# Patient Record
Sex: Female | Born: 1955 | Race: White | Hispanic: Yes | Marital: Married | Smoking: Never smoker
Health system: Southern US, Community
[De-identification: ages and names within clinical notes are randomized; demographics above are authoritative.]

## PROBLEM LIST (undated history)

## (undated) DIAGNOSIS — Q0700 Arnold-Chiari syndrome without spina bifida or hydrocephalus: Secondary | ICD-10-CM

## (undated) DIAGNOSIS — R0600 Dyspnea, unspecified: Secondary | ICD-10-CM

## (undated) DIAGNOSIS — F329 Major depressive disorder, single episode, unspecified: Secondary | ICD-10-CM

## (undated) DIAGNOSIS — R011 Cardiac murmur, unspecified: Secondary | ICD-10-CM

## (undated) DIAGNOSIS — G5603 Carpal tunnel syndrome, bilateral upper limbs: Secondary | ICD-10-CM

## (undated) DIAGNOSIS — Z972 Presence of dental prosthetic device (complete) (partial): Secondary | ICD-10-CM

## (undated) DIAGNOSIS — F419 Anxiety disorder, unspecified: Secondary | ICD-10-CM

## (undated) DIAGNOSIS — F32A Depression, unspecified: Secondary | ICD-10-CM

## (undated) DIAGNOSIS — E079 Disorder of thyroid, unspecified: Secondary | ICD-10-CM

## (undated) HISTORY — PX: TUBAL LIGATION: SHX77

---

## 2005-02-23 ENCOUNTER — Emergency Department: Payer: Self-pay | Admitting: Emergency Medicine

## 2009-03-29 ENCOUNTER — Ambulatory Visit: Payer: Self-pay | Admitting: Family Medicine

## 2011-10-28 ENCOUNTER — Ambulatory Visit: Payer: Self-pay | Admitting: Family Medicine

## 2015-02-12 ENCOUNTER — Other Ambulatory Visit: Payer: Self-pay | Admitting: Physician Assistant

## 2015-02-12 DIAGNOSIS — Z0000 Encounter for general adult medical examination without abnormal findings: Secondary | ICD-10-CM

## 2015-03-01 ENCOUNTER — Ambulatory Visit: Payer: Self-pay | Attending: Physician Assistant

## 2016-05-04 ENCOUNTER — Emergency Department
Admission: EM | Admit: 2016-05-04 | Discharge: 2016-05-04 | Disposition: A | Discharge status: Home / Self Care | Payer: BLUE CROSS/BLUE SHIELD | Attending: Emergency Medicine | Admitting: Emergency Medicine

## 2016-05-04 ENCOUNTER — Encounter: Payer: Self-pay | Admitting: Emergency Medicine

## 2016-05-04 ENCOUNTER — Emergency Department: Payer: BLUE CROSS/BLUE SHIELD

## 2016-05-04 DIAGNOSIS — G44209 Tension-type headache, unspecified, not intractable: Secondary | ICD-10-CM | POA: Diagnosis not present

## 2016-05-04 DIAGNOSIS — G43909 Migraine, unspecified, not intractable, without status migrainosus: Secondary | ICD-10-CM | POA: Diagnosis present

## 2016-05-04 MED ORDER — LORAZEPAM 1 MG PO TABS
1.0000 mg | ORAL_TABLET | Freq: Two times a day (BID) | ORAL | 0 refills | Status: DC | PRN
Start: 2016-05-04 — End: 2016-08-20

## 2016-05-04 MED ORDER — TRAMADOL HCL 50 MG PO TABS: 50.0000 mg | ORAL_TABLET | Freq: Four times a day (QID) | ORAL | 0 refills | Status: DC | PRN

## 2016-05-04 NOTE — ED Provider Notes (Signed)
Time Seen: Approximately 2142  I have reviewed the triage notes  Chief Complaint: Migraine   History of Present Illness: Alejandra Navarro is a 60 y.o. female who complains of multiple medical issues but seems to be that her headache is the main concern. Patient's history, review of systems, etc. is taken through SenegalSpanish translator. She is describing pain in both trapezius areas and a headache that starts mainly over the occipital area and moves forward into the temporal region. She apparently is been worked up rather extensively for carpal tunnel syndrome and has had nerve conduction studies. She is scheduled for outpatient surgery. Patient complained of some groin pain but not primarily to this historian and seems to be more focused on her headache and neck discomfort. She denies any nausea, vomiting, etc.   History reviewed. No pertinent past medical history.  There are no active problems to display for this patient.   Past Surgical History:  Procedure Laterality Date  . CESAREAN SECTION      Past Surgical History:  Procedure Laterality Date  . CESAREAN SECTION      Current Outpatient Rx  . Order #: [id number] Class: Print  . Order #: [id number] Class: Print    Allergies:  Review of patient's allergies indicates no known allergies.  Family History: No family history on file.  Social History: Social History  Substance Use Topics  . Smoking status: Never Smoker  . Smokeless tobacco: Never Used  . Alcohol use No     Review of Systems:   10 point review of systems was performed and was otherwise negative:  Constitutional: No fever Eyes: No visual disturbances ENT: No sore throat, ear pain Cardiac: No chest pain Respiratory: No shortness of breath, wheezing, or stridor Abdomen: No Current abdominal pain, no vomiting, No diarrhea Endocrine: No weight loss, No night sweats Extremities: No peripheral edema, cyanosis Skin: No rashes, easy bruising Neurologic: No  focal weakness, trouble with speech or swollowing Urologic: No dysuria, Hematuria, or urinary frequency   Physical Exam:  ED Triage Vitals  Enc Vitals Group     BP 05/04/16 2005 (!) 162/82     Pulse Rate 05/04/16 2005 (!) 52     Resp 05/04/16 2005 18     Temp 05/04/16 2005 98 F (36.7 C)     Temp Source 05/04/16 2005 Oral     SpO2 05/04/16 2005 99 %     Weight 05/04/16 2006 143 lb (64.9 kg)     Height 05/04/16 2006 5' (1.524 m)     Head Circumference --      Peak Flow --      Pain Score 05/04/16 2006 7     Pain Loc --      Pain Edu? --      Excl. in GC? --     General: Awake , Alert , and Oriented times 3; GCS 15Very anxious and seems to be somewhat of a scattered historian even with the language Head: Reproducible headache with palpation across the temporal region  Eyes: Pupils equal , round, reactive to light Nose/Throat: No nasal drainage, patent upper airway without erythema or exudate. No meningeal signs Neck: Supple, Full range of motion, No anterior adenopathy or palpable thyroid masses Lungs: Clear to ascultation without wheezes , rhonchi, or rales Heart: Regular rate, regular rhythm without murmurs , gallops , or rubs Abdomen: Soft, non tender without rebound, guarding , or rigidity; bowel sounds positive and symmetric in all 4 quadrants. No organomegaly .  Extremities: 2 plus symmetric pulses. No edema, clubbing or cyanosis Neurologic: normal ambulation, Motor symmetric without deficits, sensory intact Skin: warm, dry, no rashes   L  Radiology:  "Ct Head Wo Contrast  Result Date: 05/04/2016 CLINICAL DATA:  Acute onset of headache for 2 days. Initial encounter. EXAM: CT HEAD WITHOUT CONTRAST TECHNIQUE: Contiguous axial images were obtained from the base of the skull through the vertex without intravenous contrast. COMPARISON:  None. FINDINGS: There is no evidence of acute infarction, mass lesion, or intra- or extra-axial hemorrhage on CT. 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 The posterior fossa, including the cerebellum, brainstem and fourth ventricle, is within normal limits. The third and lateral ventricles, and basal ganglia are unremarkable in appearance. The cerebral hemispheres are symmetric in appearance, with normal gray-white differentiation. No mass effect or midline shift is seen. There is no evidence of fracture; visualized osseous structures are unremarkable in appearance. The visualized portions of the orbits are within normal limits. The paranasal sinuses and mastoid air cells are well-aerated. No significant soft tissue abnormalities are seen. IMPRESSION: 1. No acute intracranial pathology seen on CT. 2. Apparent Chiari I malformation, with low-lying cerebellar tonsils. This might correspond to the patient's symptoms, depending on chronicity of headache. Electronically Signed   By: Roanna RaiderJeffery  Chang M.D.   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 The shoulder was discharged with pain control and wished to be discharged before the official results of her CAT scan returned. She does [facility].  * Clinical Course     Assessment:  Acute cephalgia Tension-type headache Arnold-Chiari type I  Final Clinical Impression  Final diagnoses:  Tension-type headache, not intractable, unspecified chronicity pattern     Plan: Outpatient " Discharge Medication List as of 05/04/2016 11:04 PM    START taking these medications   Details  LORazepam (ATIVAN) 1 MG tablet Take 1 tablet (1 mg total) by mouth 2 (two) times daily as needed for anxiety., Starting Mon 05/04/2016, Print    traMADol (ULTRAM) 50 MG tablet Take 1 tablet (50 mg total) by mouth every 6 (six) hours as needed.,  Starting Mon 05/04/2016, Print      " Patient was advised to return immediately if condition worsens. Patient was advised to follow up with their primary care physician or other specialized physicians involved in their outpatient care. The patient and/or family member/power of attorney had laboratory results reviewed at the bedside. All questions and concerns were addressed and appropriate discharge instructions were distributed by the nursing staff.            Jennye MoccasinBrian S Quigley, MD 05/04/16 [phone]

## 2016-05-04 NOTE — Discharge Instructions (Signed)
Continue with follow-up with your primary doctor. Please drink plenty of fluids and return here to the emergency department especially if develops a fever or weakness. Continue follow-up for her carpal tunnel syndrome.

## 2016-05-04 NOTE — ED Triage Notes (Addendum)
Pt presents to ED via wheelchair with multiple medical complaints. Reports headache since yesterday. Pt has hx of headaches. Pt reports photosensitivity, nausea and blurry vision. Pt reports took ibuprofen without relief. Pt also reports was [facility] and was told to wait for a call from surgeon, pt reports today groin pain has been increasing. Pt alert and oriented x 4, no increased work in breathing, skin warm and dry.

## 2016-05-04 NOTE — ED Notes (Signed)
Patient discharged to home per MD order. Patient in stable condition, and deemed medically cleared by ED provider for discharge. Discharge instructions reviewed with patient/family using "Teach Back"; verbalized understanding of medication education and administration, and information about follow-up care. Denies further concerns. ° °

## 2016-06-22 ENCOUNTER — Other Ambulatory Visit: Payer: Self-pay | Admitting: Internal Medicine

## 2016-06-22 DIAGNOSIS — Z1231 Encounter for screening mammogram for malignant neoplasm of breast: Secondary | ICD-10-CM

## 2016-07-14 ENCOUNTER — Ambulatory Visit: Payer: BLUE CROSS/BLUE SHIELD

## 2016-08-05 ENCOUNTER — Ambulatory Visit
Admission: RE | Admit: 2016-08-05 | Discharge: 2016-08-05 | Disposition: A | Discharge status: Home / Self Care | Payer: BLUE CROSS/BLUE SHIELD | Source: Ambulatory Visit | Attending: Internal Medicine | Admitting: Internal Medicine

## 2016-08-05 DIAGNOSIS — Z1231 Encounter for screening mammogram for malignant neoplasm of breast: Secondary | ICD-10-CM | POA: Insufficient documentation

## 2016-08-10 ENCOUNTER — Encounter
Admission: RE | Admit: 2016-08-10 | Discharge: 2016-08-10 | Disposition: A | Discharge status: Home / Self Care | Payer: BLUE CROSS/BLUE SHIELD | Source: Ambulatory Visit | Attending: Surgery | Admitting: Surgery

## 2016-08-10 DIAGNOSIS — Z01818 Encounter for other preprocedural examination: Secondary | ICD-10-CM | POA: Insufficient documentation

## 2016-08-10 DIAGNOSIS — R0602 Shortness of breath: Secondary | ICD-10-CM | POA: Diagnosis not present

## 2016-08-10 HISTORY — DX: Cardiac murmur, unspecified: R01.1

## 2016-08-10 HISTORY — DX: Dyspnea, unspecified: R06.00

## 2016-08-10 HISTORY — DX: Depression, unspecified: F32.A

## 2016-08-10 HISTORY — DX: Carpal tunnel syndrome, bilateral upper limbs: G56.03

## 2016-08-10 HISTORY — DX: Anxiety disorder, unspecified: F41.9

## 2016-08-10 HISTORY — DX: Disorder of thyroid, unspecified: E07.9

## 2016-08-10 HISTORY — DX: Major depressive disorder, single episode, unspecified: F32.9

## 2016-08-10 LAB — COMPREHENSIVE METABOLIC PANEL
ALK PHOS: 59 U/L (ref 38–126)
ALT: 16 U/L (ref 14–54)
AST: 23 U/L (ref 15–41)
Albumin: 4.3 g/dL (ref 3.5–5.0)
Anion gap: 7 (ref 5–15)
BUN: 17 mg/dL (ref 6–20)
CALCIUM: 8.9 mg/dL (ref 8.9–10.3)
CHLORIDE: 107 mmol/L (ref 101–111)
CO2: 26 mmol/L (ref 22–32)
CREATININE: 0.64 mg/dL (ref 0.44–1.00)
GFR calc Af Amer: >60 mL/min (ref >60)
GFR calc non Af Amer: >60 mL/min (ref >60)
GLUCOSE: 88 mg/dL (ref 65–99)
Potassium: 3.7 mmol/L (ref 3.5–5.1)
SODIUM: 140 mmol/L (ref 135–145)
Total Bilirubin: 0.7 mg/dL (ref 0.3–1.2)
Total Protein: 7.3 g/dL (ref 6.5–8.1)

## 2016-08-10 LAB — CBC
HEMATOCRIT: 39.9 % (ref 35.0–47.0)
Hemoglobin: 13.5 g/dL (ref 12.0–16.0)
MCH: 30 pg (ref 26.0–34.0)
MCHC: 33.9 g/dL (ref 32.0–36.0)
MCV: 88.5 fL (ref 80.0–100.0)
PLATELETS: 107 10*3/uL — AB (ref 150–440)
RBC: 4.51 MIL/uL (ref 3.80–5.20)
RDW: 13.7 % (ref 11.5–14.5)
WBC: 3.9 10*3/uL (ref 3.6–11.0)

## 2016-08-10 LAB — DIFFERENTIAL
BASOS ABS: 0 10*3/uL (ref 0–0.1)
BASOS PCT: 0 %
Eosinophils Absolute: 0.1 10*3/uL (ref 0–0.7)
Eosinophils Relative: 1 %
LYMPHS PCT: 35 %
Lymphs Abs: 1.4 10*3/uL (ref 1.0–3.6)
Monocytes Absolute: 0.3 10*3/uL (ref 0.2–0.9)
Monocytes Relative: 8 %
NEUTROS ABS: 2.2 10*3/uL (ref 1.4–6.5)
Neutrophils Relative %: 56 %

## 2016-08-10 NOTE — Patient Instructions (Signed)
Your procedure is scheduled on: August 20, 2016 (Thursday) Su procedimiento est programado para: Report to Day Surgery.Second Floor      [facility]) Presntese a: To find out your arrival time please call [phone] between 1PM - 3PM on August 19, 2016 (Wednesday). Para saber su hora de llegada por favor llame al [phone] entre la 1PM - 3PM el da:  Remember: Instructions that are not followed completely may result in serious medical risk, up to and including death, or upon the discretion of your surgeon and anesthesiologist your surgery may need to be rescheduled.  Recuerde: Las instrucciones que no se siguen completamente Armed forces logistics/support/administrative officerpueden resultar en un riesgo de salud grave, incluyendo hasta la Rock[place]uerte o a discrecin de su cirujano y Scientific laboratory techniciananestesilogo, su ciruga se puede posponer.   __x__ 1. Do not eat food or drink liquids after midnight. No gum chewing or hard candies.  No coma alimentos ni tome lquidos despus de la medianoche.  No mastique chicle ni caramelos  duros.     _x__ 2. No alcohol for 24 hours before or after surgery.    No tome alcohol durante las 24 horas antes ni despus de la Azerba[place]uga.   ____ 3. Bring all medications with you on the day of surgery if instructed.    Lleve todos los medicamentos con usted el da de su ciruga si se le ha indicado as.   _x___ 4. Notify your doctor if there is any change in your medical condition (cold, fever,                             infections).    Informe a su mdico si hay algn cambio en su condicin mdica (resfriado, fiebre, infecciones).   Do not wear jewelry, make-up, hairpins, clips or nail polish.  No use joyas, maquillajes, pinzas/ganchos para el cabello ni esmalte de uas.  Do not wear lotions, powders, or perfumes. You may wear deodorant.  No use lociones, polvos o perfumes.  Puede usar desodorante.    Do not shave 48 hours prior to surgery. Men may shave face and neck.  No se afeite 48 horas antes de la Azerbaijanciruga.  Los  hombres pueden Commercial Metals Companyafeitarse la cara y el cuello.   Do not bring valuables to the hospital.   No lleve objetos de valor al hospital.  [facility] is not responsible for any belongings or valuables.  [place] no se hace responsable de ningn tipo de pertenencias u objetos de Licensed conveyancervalor.               Contacts, dentures or bridgework may not be worn into surgery.  Los lentes de [place]acto, las dentaduras postizas o puentes no se pueden usar en la Azerba[place]uga.  Leave your suitcase in the car. After surgery it may be brought to your room.  Deje su maleta en el auto.  Despus de la ciruga podr traerla a su habitacin.  For patients admitted to the hospital, discharge time is determined by your treatment team.  Para los pacientes que sean ingresados al hospital, el tiempo en el cual se le dar de alta es determinado por su                equipo de Beloittratamiento.   Patients discharged the day of surgery will not be allowed to drive home. A los pacientes que se les da de alta el mismo da de la ciruga no se les Investment banker, operationalpermitir conducir  a casa.   Please read over the following fact sheets that you were given: Por favor lea las siguientes hojas de informacin que le dieron:   Coughing and Deep Breathing   __x__ Take these medicines the morning of surgery with A SIP OF WATER:          Owens-Illinoisome estas medicinas la maana de la ciruga con UN SORBO DE AGUA:  1. Levothyroxine  2.   3.   4.       5.  6.  ____ Fleet Enema (as directed)          Enema de Fleet (segn lo indicado)    _x___ Use CHG Soap as directed--Instruction Sheet given          Utilice el jabn de CHG segn lo indicado  ____ Use inhalers on the day of surgery          Use los inhaladores el da de la ciruga  ____ Stop metformin 2 days prior to surgery          Deje de tomar el metformin 2 das antes de la ciruga    ____ Take 1/2 of usual insulin dose the night before surgery and none on the morning of surgery           Tome la mitad de la  dosis habitual de insulina la noche antes de la Azerbaij[place]iruga y no tome nada en la maana de la             ciruga  __x__ Stop Coumadin/Plavix/aspirin on (NO ASPIRIN)          Deje de tomar el Coumadin/Plavix/aspirina el da:  __x__ Stop Anti-inflammatories on (NO NSAIDS)          Deje de tomar Doctor, general practiceantiinflamatorios el da:   __x__ Stop supplements until after surgery  (STOP  VEGGI LIFE NOW)          Deje de tomar suplementos hasta despus de la ciruga  ____ Bring C-Pap to the hospital          Lleve el C-Pap al hospital

## 2016-08-10 NOTE — Pre-Procedure Instructions (Signed)
Medical clearance received from Dr.Hande, and placed on chart.

## 2016-08-10 NOTE — Pre-Procedure Instructions (Signed)
Component Name Value Ref Range  Vent Rate (bpm) 61   PR Interval (msec) 120   QRS Interval (msec) 80   QT Interval (msec) 430   QTc (msec) 432   Result Narrative  Normal sinus rhythm Possible Left atrial enlargement Left ventricular hypertrophy Nonspecific T wave abnormalities Abnormal ECG When compared with ECG of 01-Apr-2015 16:21, No significant change was found I reviewed and concur with this report. Electronically signed AV:[email or web address]:HANDE MD, VISH [phone]) on 07/21/2016 2:59:58 PM  Status Results Details

## 2016-08-19 MED ORDER — CEFAZOLIN SODIUM-DEXTROSE 2-4 GM/100ML-% IV SOLN
2.0000 g | Freq: Once | INTRAVENOUS | Status: AC
  Administered 2016-08-20: 2 g via INTRAVENOUS

## 2016-08-20 ENCOUNTER — Ambulatory Visit
Admission: RE | Admit: 2016-08-20 | Discharge: 2016-08-20 | Disposition: A | Discharge status: Home / Self Care | Payer: BLUE CROSS/BLUE SHIELD | Source: Ambulatory Visit | Attending: Surgery | Admitting: Surgery

## 2016-08-20 ENCOUNTER — Ambulatory Visit: Payer: BLUE CROSS/BLUE SHIELD | Admitting: Anesthesiology

## 2016-08-20 ENCOUNTER — Encounter
Admission: RE | Disposition: A | Discharge status: Home / Self Care | Payer: Self-pay | Source: Ambulatory Visit | Attending: Surgery

## 2016-08-20 ENCOUNTER — Encounter: Payer: Self-pay | Admitting: *Deleted

## 2016-08-20 DIAGNOSIS — F419 Anxiety disorder, unspecified: Secondary | ICD-10-CM | POA: Insufficient documentation

## 2016-08-20 DIAGNOSIS — R011 Cardiac murmur, unspecified: Secondary | ICD-10-CM | POA: Insufficient documentation

## 2016-08-20 DIAGNOSIS — E079 Disorder of thyroid, unspecified: Secondary | ICD-10-CM | POA: Diagnosis not present

## 2016-08-20 DIAGNOSIS — K4090 Unilateral inguinal hernia, without obstruction or gangrene, not specified as recurrent: Secondary | ICD-10-CM | POA: Insufficient documentation

## 2016-08-20 DIAGNOSIS — F329 Major depressive disorder, single episode, unspecified: Secondary | ICD-10-CM | POA: Diagnosis not present

## 2016-08-20 DIAGNOSIS — R0600 Dyspnea, unspecified: Secondary | ICD-10-CM | POA: Insufficient documentation

## 2016-08-20 HISTORY — PX: INGUINAL HERNIA REPAIR: SHX194

## 2016-08-20 SURGERY — REPAIR, HERNIA, INGUINAL, ADULT
Anesthesia: General | Laterality: Right

## 2016-08-20 MED ORDER — FENTANYL CITRATE (PF) 100 MCG/2ML IJ SOLN
INTRAMUSCULAR | Status: AC
Start: 2016-08-20 — End: 2016-08-20
  Administered 2016-08-20: 25 ug via INTRAVENOUS
  Filled 2016-08-20: qty 2

## 2016-08-20 MED ORDER — OXYCODONE HCL 5 MG/5ML PO SOLN: 5.0000 mg | Freq: Once | ORAL | Status: AC | PRN

## 2016-08-20 MED ORDER — OXYCODONE HCL 5 MG PO TABS
5.0000 mg | ORAL_TABLET | Freq: Once | ORAL | Status: AC | PRN
  Administered 2016-08-20: 5 mg via ORAL

## 2016-08-20 MED ORDER — OXYCODONE HCL 5 MG PO TABS
ORAL_TABLET | ORAL | Status: AC
  Filled 2016-08-20: qty 1

## 2016-08-20 MED ORDER — PROPOFOL 10 MG/ML IV BOLUS
INTRAVENOUS | Status: DC | PRN
  Administered 2016-08-20: 110 mg via INTRAVENOUS

## 2016-08-20 MED ORDER — BUPIVACAINE-EPINEPHRINE (PF) 0.5% -1:200000 IJ SOLN
INTRAMUSCULAR | Status: DC | PRN
  Administered 2016-08-20: 15 mL via PERINEURAL

## 2016-08-20 MED ORDER — ROCURONIUM BROMIDE 100 MG/10ML IV SOLN
INTRAVENOUS | Status: DC | PRN
  Administered 2016-08-20: 30 mg via INTRAVENOUS

## 2016-08-20 MED ORDER — FAMOTIDINE 20 MG PO TABS
20.0000 mg | ORAL_TABLET | Freq: Once | ORAL | Status: AC
  Administered 2016-08-20: 20 mg via ORAL

## 2016-08-20 MED ORDER — FAMOTIDINE 20 MG PO TABS
ORAL_TABLET | ORAL | Status: AC
  Filled 2016-08-20: qty 1

## 2016-08-20 MED ORDER — CEFAZOLIN SODIUM-DEXTROSE 2-4 GM/100ML-% IV SOLN
INTRAVENOUS | Status: AC
  Filled 2016-08-20: qty 100

## 2016-08-20 MED ORDER — FENTANYL CITRATE (PF) 100 MCG/2ML IJ SOLN
INTRAMUSCULAR | Status: DC | PRN
  Administered 2016-08-20: 100 ug via INTRAVENOUS

## 2016-08-20 MED ORDER — BUPIVACAINE-EPINEPHRINE (PF) 0.5% -1:200000 IJ SOLN
INTRAMUSCULAR | Status: AC
  Filled 2016-08-20: qty 30

## 2016-08-20 MED ORDER — HYDROCODONE-ACETAMINOPHEN 5-325 MG PO TABS: 1.0000 | ORAL_TABLET | ORAL | 0 refills | Status: DC | PRN

## 2016-08-20 MED ORDER — DEXAMETHASONE SODIUM PHOSPHATE 10 MG/ML IJ SOLN
INTRAMUSCULAR | Status: DC | PRN
  Administered 2016-08-20: 4 mg via INTRAVENOUS

## 2016-08-20 MED ORDER — ONDANSETRON HCL 4 MG/2ML IJ SOLN
INTRAMUSCULAR | Status: DC | PRN
  Administered 2016-08-20: 4 mg via INTRAVENOUS

## 2016-08-20 MED ORDER — LACTATED RINGERS IV SOLN
INTRAVENOUS | Status: DC
  Administered 2016-08-20: 07:00:00 via INTRAVENOUS

## 2016-08-20 MED ORDER — SUGAMMADEX SODIUM 200 MG/2ML IV SOLN
INTRAVENOUS | Status: DC | PRN
  Administered 2016-08-20: 130 mg via INTRAVENOUS

## 2016-08-20 MED ORDER — FLEET ENEMA 7-19 GM/118ML RE ENEM: 1.0000 | ENEMA | Freq: Once | RECTAL | Status: DC

## 2016-08-20 MED ORDER — HYDROCODONE-ACETAMINOPHEN 5-325 MG PO TABS: 1.0000 | ORAL_TABLET | ORAL | Status: DC | PRN

## 2016-08-20 MED ORDER — LIDOCAINE HCL (CARDIAC) 20 MG/ML IV SOLN
INTRAVENOUS | Status: DC | PRN
  Administered 2016-08-20: 80 mg via INTRAVENOUS

## 2016-08-20 MED ORDER — FENTANYL CITRATE (PF) 100 MCG/2ML IJ SOLN
25.0000 ug | INTRAMUSCULAR | Status: DC | PRN
  Administered 2016-08-20 (×2): 25 ug via INTRAVENOUS

## 2016-08-20 SURGICAL SUPPLY — 24 items
BLADE SURG 15 STRL LF DISP TIS (BLADE) ×1 IMPLANT
BLADE SURG 15 STRL SS (BLADE) ×1
CANISTER SUCT 1200ML W/VALVE (MISCELLANEOUS) ×2 IMPLANT
CHLORAPREP W/TINT 26ML (MISCELLANEOUS) ×4 IMPLANT
DERMABOND ADVANCED (GAUZE/BANDAGES/DRESSINGS) ×1
DERMABOND ADVANCED .7 DNX12 (GAUZE/BANDAGES/DRESSINGS) ×1 IMPLANT
DRAIN PENROSE 5/8X18 LTX STRL (WOUND CARE) ×2 IMPLANT
DRAPE LAPAROTOMY 77X122 PED (DRAPES) ×2 IMPLANT
ELECT REM PT RETURN 9FT ADLT (ELECTROSURGICAL) ×2
ELECTRODE REM PT RTRN 9FT ADLT (ELECTROSURGICAL) ×1 IMPLANT
GLOVE BIO SURGEON STRL SZ7.5 (GLOVE) ×10 IMPLANT
GOWN STRL REUS W/ TWL LRG LVL3 (GOWN DISPOSABLE) ×3 IMPLANT
GOWN STRL REUS W/TWL LRG LVL3 (GOWN DISPOSABLE) ×3
KIT RM TURNOVER STRD PROC AR (KITS) ×2 IMPLANT
LABEL OR SOLS (LABEL) ×2 IMPLANT
NEEDLE HYPO 25X1 1.5 SAFETY (NEEDLE) ×2 IMPLANT
NS IRRIG 500ML POUR BTL (IV SOLUTION) ×2 IMPLANT
PACK BASIN MINOR ARMC (MISCELLANEOUS) ×2 IMPLANT
SUT CHROMIC 4 0 RB 1X27 (SUTURE) ×2 IMPLANT
SUT MNCRL AB 4-0 PS2 18 (SUTURE) ×2 IMPLANT
SUT SURGILON 0 30 BLK (SUTURE) ×6 IMPLANT
SUT VIC AB 4-0 SH 27 (SUTURE) ×2
SUT VIC AB 4-0 SH 27XANBCTRL (SUTURE) ×2 IMPLANT
SYRINGE 10CC LL (SYRINGE) ×2 IMPLANT

## 2016-08-20 NOTE — Anesthesia Preprocedure Evaluation (Signed)
Anesthesia Evaluation  Patient identified by MRN, date of birth, ID band Patient awake    Reviewed: Allergy & Precautions, H&P , NPO status , Patient's Chart, lab work & pertinent test results  History of Anesthesia Complications (+) PROLONGED EMERGENCE and history of anesthetic complications  Airway Mallampati: III  TM Distance: >3 FB Neck ROM: limited    Dental no notable dental hx. (+) Poor Dentition, Chipped, Implants   Pulmonary shortness of breath,    Pulmonary exam normal breath sounds clear to auscultation       Cardiovascular Exercise Tolerance: Good (-) angina+ DOE  Normal cardiovascular exam+ Valvular Problems/Murmurs AI and MR  Rhythm:regular Rate:Normal     Neuro/Psych PSYCHIATRIC DISORDERS Anxiety Depression  Neuromuscular disease    GI/Hepatic negative GI ROS, Neg liver ROS,   Endo/Other  negative endocrine ROS  Renal/GU      Musculoskeletal   Abdominal   Peds  Hematology negative hematology ROS (+)   Anesthesia Other Findings Past Medical History: No date: Anxiety No date: Bilateral carpal tunnel syndrome No date: Depression No date: Dyspnea     Comment: with exertion No date: Heart murmur No date: Thyroid disease  Past Surgical History: No date: CESAREAN SECTION     Comment: X 1 No date: TUBAL LIGATION  BMI    Body Mass Index:  26.16 kg/m      Reproductive/Obstetrics negative OB ROS                             Anesthesia Physical Anesthesia Plan  ASA: III  Anesthesia Plan: General ETT   Post-op Pain Management:    Induction:   Airway Management Planned:   Additional Equipment:   Intra-op Plan:   Post-operative Plan:   Informed Consent: I have reviewed the patients History and Physical, chart, labs and discussed the procedure including the risks, benefits and alternatives for the proposed anesthesia with the patient or authorized representative  who has indicated his/her understanding and acceptance.   Dental Advisory Given  Plan Discussed with: Anesthesiologist, CRNA and Surgeon  Anesthesia Plan Comments:         Anesthesia Quick Evaluation

## 2016-08-20 NOTE — OR Nursing (Signed)
Fleets given

## 2016-08-20 NOTE — OR Nursing (Signed)
Pt states small amount of stool with lots of water from enema sensation to have bowel movement gone

## 2016-08-20 NOTE — Anesthesia Postprocedure Evaluation (Signed)
Anesthesia Post Note  Patient: Alejandra Navarro  Procedure(s) Performed: Procedure(s) (LRB): HERNIA REPAIR INGUINAL ADULT (Right)  Patient location during evaluation: PACU Anesthesia Type: General Level of consciousness: awake and alert Pain management: pain level controlled Vital Signs Assessment: post-procedure vital signs reviewed and stable Respiratory status: spontaneous breathing, nonlabored ventilation, respiratory function stable and patient connected to nasal cannula oxygen Cardiovascular status: blood pressure returned to baseline and stable Postop Assessment: no signs of nausea or vomiting Anesthetic complications: no    Last Vitals:  Vitals:   08/20/16 1001 08/20/16 1029  BP: 120/60 124/78  Pulse: (!) 59   Resp: 16 16  Temp:      Last Pain:  Vitals:   08/20/16 0947  TempSrc:   PainSc: 2                  Cleda MccreedyJoseph K Piscitello

## 2016-08-20 NOTE — Anesthesia Procedure Notes (Signed)
Procedure Name: Intubation Date/Time: 08/20/2016 7:50 AM Performed by: Peyton NajjarSIMMONS, Aleida Crandell Pre-anesthesia Checklist: Patient identified, Patient being monitored, Timeout performed, Emergency Drugs available and Suction available Patient Re-evaluated:Patient Re-evaluated prior to inductionOxygen Delivery Method: Circle system utilized Preoxygenation: Pre-oxygenation with 100% oxygen Intubation Type: IV induction Ventilation: Mask ventilation without difficulty Laryngoscope Size: Mac and 3 Grade View: Grade I Tube type: Oral Tube size: 6.5 mm Number of attempts: 1 Placement Confirmation: ETT inserted through vocal cords under direct vision,  positive ETCO2 and breath sounds checked- equal and bilateral Secured at: 21 cm Tube secured with: Tape Dental Injury: Teeth and Oropharynx as per pre-operative assessment

## 2016-08-20 NOTE — Transfer of Care (Addendum)
Immediate Anesthesia Transfer of Care Note  Patient: Alejandra Navarro  Procedure(s) Performed: Procedure(s): HERNIA REPAIR INGUINAL ADULT (Right)  Patient Location: PACU  Anesthesia Type:General  Level of Consciousness: awake, alert , oriented and patient cooperative  Airway & Oxygen Therapy: Patient Spontanous Breathing and Patient connected to nasal cannula oxygen  Post-op Assessment: Report given to RN, Post -op Vital signs reviewed and stable and Patient moving all extremities  Post vital signs: Reviewed and stable  Last Vitals:  Vitals:   08/20/16 0613 08/20/16 0902  BP: (!) 141/88 125/76  Pulse: 69 81  Resp: 16 11  Temp: 36.9 C 36.8 C    Last Pain:  Vitals:   08/20/16 0613  TempSrc: Tympanic  PainSc: 8          Complications: No apparent anesthesia complications

## 2016-08-20 NOTE — H&P (Signed)
  She comes in today prepared for right inguinal hernia repair.  She does report some constipation and feels that she needs to move her bowels but can't.  The right side was marked YES  Plan is to give her a Fleet's enema prior to surgery. I discussed the plan for surgery

## 2016-08-20 NOTE — OR Nursing (Signed)
Dr Katrinka BlazingSmith states does not need to see pt. Interpreter in to discuss discharge instructions with pt.

## 2016-08-20 NOTE — Op Note (Signed)
OPERATIVE REPORT  PREOPERATIVE DIAGNOSIS: right inguinal hernia  POSTOPERATIVE DIAGNOSIS:right  inguinal hernia  PROCEDURE:  right inguinal hernia repair  ANESTHESIA:  General  SURGEON:  Renda RollsWilton Smith M.D.  INDICATIONS: She reported bulging in the right groin. A right inguinal hernia was demonstrated on physical exam and repair was recommended for definitive treatment.  With the patient on the operating table in the supine position the right lower quadrant was prepared with ChloraPrep and draped in a sterile manner. A transversely oriented suprapubic incision was made and carried down through subcutaneous tissues. Electrocautery was used for hemostasis. The Scarpa's fascia was incised. The external oblique aponeurosis was incised along the course of its fibers to open the external ring . There was a finding of an indirect hernia sac which was dissected free from surrounding structures. The round ligament was identified and ligated with 4-0 chromic suture ligature and divided. The sac was opened and its continuity with the peritoneal cavity was demonstrated. A high ligation of the sac was done with a 4-0 Vicryl suture ligature. The sac was excised and the stump was allowed to retract. The repair was carried out with 0 Surgilon suturing the conjoined tendon to the shelving edge of the inguinal ligament and completely obliterated the internal ring.  The cut edges of the external oblique aponeurosis were closed with a running 4-0 Vicryl suture to re-create the external ring. The deep fascia superior and lateral to the repair site was infiltrated with half percent Sensorcaine with epinephrine. Subcutaneous tissues were also infiltrated. The Scarpa's fascia was closed with interrupted 4-0 Vicryl sutures. The skin was closed with running 4-0 Monocryl subcuticular suture and LiquiBand. The testicle remained in the scrotum  The patient appeared to be in satisfactory condition and was prepared for transfer to  the recovery room.  Renda RollsWilton Smith M.D.

## 2016-08-20 NOTE — Discharge Instructions (Addendum)
Take Tylenol or Norco if needed for pain.  Take laxative if needed for constipation.  Suggest taking over-the-counter stool softener docussate sodium 100 mg 2 tablets per day  May shower.  Avoid straining and heavy lifting    AMBULATORY SURGERY  DISCHARGE INSTRUCTIONS   1) The drugs that you were given will stay in your system until tomorrow so for the next 24 hours you should not:  A) Drive an automobile B) Make any legal decisions C) Drink any alcoholic beverage   2) You may resume regular meals tomorrow.  Today it is better to start with liquids and gradually work up to solid foods.  You may eat anything you prefer, but it is better to start with liquids, then soup and crackers, and gradually work up to solid foods.   3) Please notify your doctor immediately if you have any unusual bleeding, trouble breathing, redness and pain at the surgery site, drainage, fever, or pain not relieved by medication.    4) Additional Instructions:        Please contact your physician with any problems or Same Day Surgery at [phone], Monday through Friday 6 am to 4 pm, or [place] at [facility] Main number at [phone].

## 2017-08-11 ENCOUNTER — Encounter: Payer: Self-pay | Admitting: *Deleted

## 2017-08-11 ENCOUNTER — Other Ambulatory Visit: Payer: Self-pay

## 2017-08-20 ENCOUNTER — Ambulatory Visit
Admission: RE | Admit: 2017-08-20 | Discharge: 2017-08-20 | Disposition: A | Discharge status: Home / Self Care | Payer: BLUE CROSS/BLUE SHIELD | Source: Ambulatory Visit | Attending: Unknown Physician Specialty | Admitting: Unknown Physician Specialty

## 2017-08-20 ENCOUNTER — Ambulatory Visit: Payer: BLUE CROSS/BLUE SHIELD | Admitting: Anesthesiology

## 2017-08-20 ENCOUNTER — Encounter
Admission: RE | Disposition: A | Discharge status: Home / Self Care | Payer: Self-pay | Source: Ambulatory Visit | Attending: Unknown Physician Specialty

## 2017-08-20 DIAGNOSIS — E669 Obesity, unspecified: Secondary | ICD-10-CM | POA: Diagnosis not present

## 2017-08-20 DIAGNOSIS — Z8279 Family history of other congenital malformations, deformations and chromosomal abnormalities: Secondary | ICD-10-CM | POA: Diagnosis not present

## 2017-08-20 DIAGNOSIS — Z823 Family history of stroke: Secondary | ICD-10-CM | POA: Insufficient documentation

## 2017-08-20 DIAGNOSIS — Z9889 Other specified postprocedural states: Secondary | ICD-10-CM | POA: Diagnosis not present

## 2017-08-20 DIAGNOSIS — Z6826 Body mass index (BMI) 26.0-26.9, adult: Secondary | ICD-10-CM | POA: Insufficient documentation

## 2017-08-20 DIAGNOSIS — Z9851 Tubal ligation status: Secondary | ICD-10-CM | POA: Diagnosis not present

## 2017-08-20 DIAGNOSIS — Z8249 Family history of ischemic heart disease and other diseases of the circulatory system: Secondary | ICD-10-CM | POA: Insufficient documentation

## 2017-08-20 DIAGNOSIS — Z836 Family history of other diseases of the respiratory system: Secondary | ICD-10-CM | POA: Diagnosis not present

## 2017-08-20 DIAGNOSIS — Z79899 Other long term (current) drug therapy: Secondary | ICD-10-CM | POA: Diagnosis not present

## 2017-08-20 DIAGNOSIS — Q0700 Arnold-Chiari syndrome without spina bifida or hydrocephalus: Secondary | ICD-10-CM | POA: Insufficient documentation

## 2017-08-20 DIAGNOSIS — E039 Hypothyroidism, unspecified: Secondary | ICD-10-CM | POA: Insufficient documentation

## 2017-08-20 DIAGNOSIS — Z79890 Hormone replacement therapy (postmenopausal): Secondary | ICD-10-CM | POA: Insufficient documentation

## 2017-08-20 DIAGNOSIS — G5603 Carpal tunnel syndrome, bilateral upper limbs: Secondary | ICD-10-CM | POA: Diagnosis present

## 2017-08-20 HISTORY — DX: Arnold-Chiari syndrome without spina bifida or hydrocephalus: Q07.00

## 2017-08-20 HISTORY — PX: CARPAL TUNNEL RELEASE: SHX101

## 2017-08-20 HISTORY — DX: Presence of dental prosthetic device (complete) (partial): Z97.2

## 2017-08-20 SURGERY — CARPAL TUNNEL RELEASE
Anesthesia: General | Laterality: Bilateral | Wound class: Clean

## 2017-08-20 MED ORDER — OXYCODONE HCL 5 MG PO TABS: 5.0000 mg | ORAL_TABLET | Freq: Once | ORAL | Status: DC | PRN

## 2017-08-20 MED ORDER — LACTATED RINGERS IV SOLN
INTRAVENOUS | Status: DC
  Administered 2017-08-20: 08:00:00 via INTRAVENOUS

## 2017-08-20 MED ORDER — MIDAZOLAM HCL 5 MG/5ML IJ SOLN
INTRAMUSCULAR | Status: DC | PRN
  Administered 2017-08-20: 2 mg via INTRAVENOUS

## 2017-08-20 MED ORDER — NORCO 5-325 MG PO TABS: 1.0000 | ORAL_TABLET | Freq: Four times a day (QID) | ORAL | 0 refills | Status: AC | PRN

## 2017-08-20 MED ORDER — FENTANYL CITRATE (PF) 100 MCG/2ML IJ SOLN: 25.0000 ug | INTRAMUSCULAR | Status: DC | PRN

## 2017-08-20 MED ORDER — ACETAMINOPHEN 10 MG/ML IV SOLN: 1000.0000 mg | Freq: Once | INTRAVENOUS | Status: DC | PRN

## 2017-08-20 MED ORDER — DEXAMETHASONE SODIUM PHOSPHATE 4 MG/ML IJ SOLN
INTRAMUSCULAR | Status: DC | PRN
  Administered 2017-08-20: 4 mg via INTRAVENOUS

## 2017-08-20 MED ORDER — ONDANSETRON HCL 4 MG/2ML IJ SOLN: 4.0000 mg | Freq: Once | INTRAMUSCULAR | Status: DC | PRN

## 2017-08-20 MED ORDER — ONDANSETRON HCL 4 MG/2ML IJ SOLN
INTRAMUSCULAR | Status: DC | PRN
  Administered 2017-08-20: 4 mg via INTRAVENOUS

## 2017-08-20 MED ORDER — FENTANYL CITRATE (PF) 100 MCG/2ML IJ SOLN
INTRAMUSCULAR | Status: DC | PRN
  Administered 2017-08-20: 50 ug via INTRAVENOUS

## 2017-08-20 MED ORDER — OXYCODONE HCL 5 MG/5ML PO SOLN: 5.0000 mg | Freq: Once | ORAL | Status: DC | PRN

## 2017-08-20 MED ORDER — PROPOFOL 10 MG/ML IV BOLUS
INTRAVENOUS | Status: DC | PRN
  Administered 2017-08-20: 100 mg via INTRAVENOUS

## 2017-08-20 MED ORDER — BUPIVACAINE HCL (PF) 0.5 % IJ SOLN
INTRAMUSCULAR | Status: DC | PRN
  Administered 2017-08-20 (×2): 5 mL

## 2017-08-20 MED ORDER — LIDOCAINE HCL (CARDIAC) 20 MG/ML IV SOLN
INTRAVENOUS | Status: DC | PRN
  Administered 2017-08-20: 50 mg via INTRATRACHEAL

## 2017-08-20 MED ORDER — LACTATED RINGERS IV SOLN: INTRAVENOUS | Status: DC

## 2017-08-20 SURGICAL SUPPLY — 23 items
BNDG ESMARK 4X12 TAN STRL LF (GAUZE/BANDAGES/DRESSINGS) ×3 IMPLANT
BNDG STRETCH 4X75 STRL LF (GAUZE/BANDAGES/DRESSINGS) ×3 IMPLANT
COVER LIGHT HANDLE FLEXIBLE (MISCELLANEOUS) ×6 IMPLANT
CUFF TOURN SGL QUICK 18 (TOURNIQUET CUFF) ×6 IMPLANT
DURAPREP 26ML APPLICATOR (WOUND CARE) ×6 IMPLANT
GAUZE SPONGE 4X4 12PLY STRL (GAUZE/BANDAGES/DRESSINGS) ×6 IMPLANT
GLOVE BIO SURGEON STRL SZ7.5 (GLOVE) ×9 IMPLANT
GLOVE BIO SURGEON STRL SZ8 (GLOVE) ×6 IMPLANT
GLOVE INDICATOR 8.0 STRL GRN (GLOVE) ×3 IMPLANT
GOWN STRL REUS W/ TWL LRG LVL3 (GOWN DISPOSABLE) ×3 IMPLANT
GOWN STRL REUS W/TWL LRG LVL3 (GOWN DISPOSABLE) ×6
KIT ROOM TURNOVER OR (KITS) ×3 IMPLANT
NS IRRIG 500ML POUR BTL (IV SOLUTION) ×3 IMPLANT
PACK EXTREMITY ARMC (MISCELLANEOUS) ×3 IMPLANT
PAD GROUND ADULT SPLIT (MISCELLANEOUS) ×3 IMPLANT
SOL PREP PVP 2OZ (MISCELLANEOUS) ×3
SOLUTION PREP PVP 2OZ (MISCELLANEOUS) ×1 IMPLANT
SPLINT WRIST M LT TX990308 (SOFTGOODS) ×3 IMPLANT
SPLINT WRIST M RT TX990303 (SOFTGOODS) ×3 IMPLANT
STOCKINETTE 4X48 STRL (DRAPES) ×6 IMPLANT
STRAP BODY AND KNEE 60X3 (MISCELLANEOUS) ×3 IMPLANT
SUT ETHILON 3-0 FS-10 30 BLK (SUTURE) ×3
SUTURE EHLN 3-0 FS-10 30 BLK (SUTURE) ×1 IMPLANT

## 2017-08-20 NOTE — H&P (Signed)
  H and P reviewed. No changes. Uploaded at later date. 

## 2017-08-20 NOTE — Anesthesia Postprocedure Evaluation (Signed)
Anesthesia Post Note  Patient: Alejandra Navarro  Procedure(s) Performed: CARPAL TUNNEL RELEASE (Bilateral )  Patient location during evaluation: PACU Anesthesia Type: General Level of consciousness: awake and alert, oriented and patient cooperative Pain management: pain level controlled Vital Signs Assessment: post-procedure vital signs reviewed and stable Respiratory status: spontaneous breathing, nonlabored ventilation and respiratory function stable Cardiovascular status: blood pressure returned to baseline and stable Postop Assessment: adequate PO intake Anesthetic complications: no    Reed BreechAndrea [place]

## 2017-08-20 NOTE — Transfer of Care (Signed)
Immediate Anesthesia Transfer of Care Note  Patient: Alejandra Navarro  Procedure(s) Performed: CARPAL TUNNEL RELEASE (Bilateral )  Patient Location: PACU  Anesthesia Type: General  Level of Consciousness: awake, alert  and patient cooperative  Airway and Oxygen Therapy: Patient Spontanous Breathing and Patient connected to supplemental oxygen  Post-op Assessment: Post-op Vital signs reviewed, Patient's Cardiovascular Status Stable, Respiratory Function Stable, Patent Airway and No signs of Nausea or vomiting  Post-op Vital Signs: Reviewed and stable  Complications: No apparent anesthesia complications

## 2017-08-20 NOTE — Op Note (Signed)
DATE OF SURGERY:  08/20/2017  PATIENT NAME:  Alejandra Navarro   DOB: 08/11/1956  MRN: [id number]  PRE-OPERATIVE DIAGNOSIS: Bilateral carpal tunnel syndrome  POST-OPERATIVE DIAGNOSIS:  Same  PROCEDURE: Bilateral carpal tunnel release  SURGEON: Dr. Erin SonsHarold Graviela Nodal, Montez HagemanJr. M.D.  ANESTHESIA: Gen.   INDICATIONS FOR SURGERY: Alejandra Navarro is a 61 y.o. year old female with a long history of numbness and paresthesias in both hands. Nerve conduction studies demonstrated findings consistent with moderate  median nerve compression.The patient had not seen any significant improvement despite conservative nonsurgical intervention. After discussion of the risks and benefits of surgical intervention, the patient expressed understanding of the risks benefits and agreed with plans for bilateral carpal tunnel release.   PROCEDURE IN DETAIL: The patient was taken the operating room where satisfactory general anesthesia was achieved. A tourniquet was placed on the patient's right upper arm.The right hand and arm were prepped  and draped in the usual sterile fashion. A "time-out" was performed as per usual protocol. The hand and forearm were exsanguinated using an Esmarch and the tourniquet was inflated to 250 mmHg.  An incision was made just ulnar to the thenar palmar crease. Dissection was carried down through the palmar fascia to the transverse carpal ligament. The transverse carpal ligament was sharply incised, taking care to protect the underlying structures within the carpal tunnel. Complete release of the transverse carpal ligament was achieved. There was no evidence of a mass or proliferative synovitis within the carpal tunnel. The median nerve did appear to be slightly flattened. The wound was irrigated with saline. The tourniquet was released at this time. It had been up for about 6 minutes. Bleeding was controlled with digital pressure and coagulation cautery. I did inject the subcutaneous tissue of the  wound with about 5 cc of 0.5% Marcaine without epinephrine. The skin was then re-approximated with interrupted sutures of #3-0 nylon. A sterile dressing was applied followed by application of a removable type of volar splint.   I then turned my attention to the left upper extremity. The left hand and arm were prepped and draped in usual sterile fashion. The left hand and distal forearm were exsanguinated using an Esmarch and the tourniquet was inflated to 200 mmHg. Again an incision was made just ulnar to the thenar palmar crease. Dissection was carried down through the palmar fascia to the transverse carpal ligament. The transverse carpal ligament was sharply incised. Care was taken to protect the median nerve. Complete release of the transverse carpal ligament was achieved. There was no evidence of a mass or proliferative synovitis compressing the nerve. The median nerve did appear to be slightly flattened. The wound was irrigated with saline. The tourniquet was released at this time. It been up about 11 minutes. Bleeding was controlled with digital pressure and coagulation cautery. I did inject the subcutaneous tissue of the wound with about 5 cc mixture of 0.5% Marcaine without epinephrine. The skin was then reapproximated  with interrupted sutures of 3-0 nylon. Dressing was applied followed by application of a removable type of volar splint.  The patient was awakened and transferred to a stretcher bed.  The patient tolerated the procedure well and was transported to the PACU in stable condition. Blood loss was negligible.  Dr. Isidoro DonningHarold Liam Bossman, Jr. M.D.

## 2017-08-20 NOTE — Anesthesia Preprocedure Evaluation (Signed)
Anesthesia Evaluation  Patient identified by MRN, date of birth, ID band Patient awake  Preop documentation limited or incomplete due to emergent nature of procedure.  History of Anesthesia Complications Negative for: history of anesthetic complications  Airway Mallampati: I  TM Distance: >3 FB Neck ROM: Full    Dental  (+) Implants   Pulmonary neg pulmonary ROS,    Pulmonary exam normal breath sounds clear to auscultation       Cardiovascular Exercise Tolerance: Good Normal cardiovascular exam+ Valvular Problems/Murmurs (murmur)  Rhythm:Regular Rate:Normal     Neuro/Psych PSYCHIATRIC DISORDERS Anxiety Arnold-Chiari malformation    GI/Hepatic negative GI ROS,   Endo/Other  Hypothyroidism   Renal/GU negative Renal ROS     Musculoskeletal   Abdominal   Peds  Hematology negative hematology ROS (+)   Anesthesia Other Findings   Reproductive/Obstetrics                             Anesthesia Physical Anesthesia Plan  ASA: II  Anesthesia Plan: General   Post-op Pain Management:    Induction: Intravenous  PONV Risk Score and Plan:   Airway Management Planned: LMA  Additional Equipment:   Intra-op Plan:   Post-operative Plan: Extubation in OR  Informed Consent: I have reviewed the patients History and Physical, chart, labs and discussed the procedure including the risks, benefits and alternatives for the proposed anesthesia with the patient or authorized representative who has indicated his/her understanding and acceptance.     Plan Discussed with: CRNA  Anesthesia Plan Comments:         Anesthesia Quick Evaluation

## 2017-08-20 NOTE — Discharge Instructions (Signed)
Anestesia general en los adultos, cuidados posteriores (General Anesthesia, Adult, Care After) Estas indicaciones le proporcionan informacin acerca de cmo deber cuidarse despus del procedimiento. El mdico tambin podr darle instrucciones ms especficas. El tratamiento ha sido planificado segn las prcticas mdicas actuales, pero en algunos casos pueden ocurrir problemas. Comunquese con el mdico si tiene algn problema o dudas despus del procedimiento. QU ESPERAR DESPUS DEL PROCEDIMIENTO Despus del procedimiento, es comn DIRECTVtener los siguientes sntomas:  Vmitos.  Dolor de Advertising copywritergarganta.  Lentitud mental. Es normal sentir lo siguiente:  Nuseas.  Fro o escalofros.  Ice pack  Elevation  RTC in about 2 weeks  Somnolencia.  Cansancio.  Dolor o inflamacin, incluso en partes del cuerpo no afectadas por la ciruga. INSTRUCCIONES PARA EL CUIDADO EN EL HOGAR Durante al menos 24horas despus del procedimiento:  No haga lo siguiente: ? Participar en actividades que impliquen posibles cadas o lesiones. ? Conducir vehculos. ? Operar maquinarias pesadas. ? Beber alcohol. ? Tomar somnferos o medicamentos que causen somnolencia. ? Firmar documentos legales ni tomar Teachers Insurance and Annuity Associationdecisiones importantes. ? Cuidar a nios por su cuenta.  Hacer reposo. Comida y bebida  Si vomita, tome agua, jugo o sopa una vez que pueda beber sin vomitar.  Beba suficiente lquido para Photographermantener la orina clara o de color amarillo plido.  Asegrese de no tener nuseas antes de ingerir alimentos slidos.  Siga la dieta recomendada por el mdico. Instrucciones generales  Permanezca con un adulto responsable hasta que est completamente despierto y consciente.  Retome sus actividades normales como se lo haya indicado el mdico. Pregntele al mdico qu actividades son seguras para usted.  Tome los medicamentos de venta libre y los recetados solamente como se lo haya indicado el mdico.  Si fuma, no  lo haga sin supervisin.  Concurra a todas las visitas de control como se lo haya indicado el mdico. Esto es importante. SOLICITE ATENCIN MDICA SI:  Contina con nuseas o vmitos en su casa, y los medicamentos no ayudan.  No puede beber lquidos ni volver a comer.  No puede orinar despus de 8 a 12horas.  Tiene una erupcin cutnea.  Tiene fiebre.  Tiene cada vez ms enrojecimiento en la zona de la ciruga. SOLICITE ATENCIN MDICA DE INMEDIATO SI:  Tiene dificultad para respirar.  Siente dolor en el pecho.  Tiene una hemorragia imprevista.  Siente que tiene un problema potencialmente mortal o urgente. Esta informacin no tiene Theme park managercomo fin reemplazar el consejo del mdico. Asegrese de hacerle al mdico cualquier pregunta que tenga. Document Released: 09/07/2005 Document Revised: 09/28/2014 Document Reviewed: 08/22/2015 Elsevier Interactive Patient Education  [employer]2018 Elsevier Inc.

## 2017-08-20 NOTE — Anesthesia Procedure Notes (Signed)
Procedure Name: LMA Insertion Date/Time: 08/20/2017 9:03 AM Performed by: Andee PolesBush, Sahith Nurse, CRNA Pre-anesthesia Checklist: Patient identified, Emergency Drugs available, Suction available, Timeout performed and Patient being monitored Patient Re-evaluated:Patient Re-evaluated prior to induction Oxygen Delivery Method: Circle system utilized Preoxygenation: Pre-oxygenation with 100% oxygen Induction Type: IV induction LMA: LMA inserted LMA Size: 4.0 Number of attempts: 1 Placement Confirmation: positive ETCO2 and breath sounds checked- equal and bilateral Tube secured with: Tape

## 2018-05-23 ENCOUNTER — Emergency Department: Payer: BLUE CROSS/BLUE SHIELD

## 2018-05-23 ENCOUNTER — Encounter: Payer: Self-pay | Admitting: *Deleted

## 2018-05-23 ENCOUNTER — Emergency Department
Admission: EM | Admit: 2018-05-23 | Discharge: 2018-05-23 | Disposition: A | Discharge status: Home / Self Care | Payer: BLUE CROSS/BLUE SHIELD | Attending: Emergency Medicine | Admitting: Emergency Medicine

## 2018-05-23 ENCOUNTER — Other Ambulatory Visit: Payer: Self-pay

## 2018-05-23 DIAGNOSIS — Y929 Unspecified place or not applicable: Secondary | ICD-10-CM | POA: Diagnosis not present

## 2018-05-23 DIAGNOSIS — Y998 Other external cause status: Secondary | ICD-10-CM | POA: Insufficient documentation

## 2018-05-23 DIAGNOSIS — S93104A Unspecified dislocation of right toe(s), initial encounter: Secondary | ICD-10-CM

## 2018-05-23 DIAGNOSIS — Y9389 Activity, other specified: Secondary | ICD-10-CM | POA: Insufficient documentation

## 2018-05-23 DIAGNOSIS — W228XXA Striking against or struck by other objects, initial encounter: Secondary | ICD-10-CM | POA: Insufficient documentation

## 2018-05-23 DIAGNOSIS — S93114A Dislocation of interphalangeal joint of right lesser toe(s), initial encounter: Secondary | ICD-10-CM | POA: Diagnosis not present

## 2018-05-23 DIAGNOSIS — Z79899 Other long term (current) drug therapy: Secondary | ICD-10-CM | POA: Diagnosis not present

## 2018-05-23 DIAGNOSIS — S99921A Unspecified injury of right foot, initial encounter: Secondary | ICD-10-CM | POA: Diagnosis present

## 2018-05-23 MED ORDER — MELOXICAM 15 MG PO TABS: 15.0000 mg | ORAL_TABLET | Freq: Every day | ORAL | 0 refills | Status: AC

## 2018-05-23 NOTE — ED Provider Notes (Signed)
[facility] Emergency Department Provider Note  ____________________________________________  Time seen: Approximately 9:15 PM  I have reviewed the triage vital signs and the nursing notes.   HISTORY  Chief Complaint Toe Injury    HPI Alejandra Navarro is a 62 y.o. female who presents the emergency department complaining of fifth digit pain to the right foot.  Patient was playing with her granddaughter pushing a rolling right and cart.  The cart ran over the patient's toe.  Patient reports significant pain just to the digit.  No foot pain.  No other injury.  No lacerations.  Patient was given 400 mg of ibuprofen prior to arrival.   No other injury or complaint at this time.   Past Medical History:  Diagnosis Date  . Anxiety   . Arnold-Chiari malformation (HCC)   . Bilateral carpal tunnel syndrome   . Depression   . Dyspnea    with exertion  . Heart murmur    mild  . 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 Thyroid disease     There are no active problems to display for this patient.   Past Surgical History:  Procedure Laterality Date  . CARPAL TUNNEL RELEASE Bilateral 08/20/2017   Procedure: CARPAL TUNNEL RELEASE;  Surgeon: Erin Sons, MD;  Location: [facility];  Service: Orthopedics;  Laterality: Bilateral;  . CESAREAN SECTION     X 1  . INGUINAL HERNIA REPAIR Right 08/20/2016   Procedure: HERNIA REPAIR INGUINAL ADULT;  Surgeon: Nadeen Landau, MD;  Location: [facility] OR[facility];  Service: General;  Laterality: Right;  . TUBAL LIGATION      Prior to Admission medications   Medication Sig Start Date End Date Taking? Authorizing Provider  bisacodyl (DULCOLAX) 5 MG EC tablet Take 5 mg by mouth daily as needed for mild constipation.     [provider]  furosemide (LASIX) 20 MG tablet Take 20 mg by mouth daily as needed for fluid.    [provider]  levothyroxine (SYNTHROID,  LEVOTHROID) 25 MCG tablet Take 25 mcg by mouth daily. 07/27/16   [provider]  meloxicam (MOBIC) 15 MG tablet Take 1 tablet (15 mg total) by mouth daily. 05/23/18   Thermon Zulauf, Delorise Royals, PA-C  NORCO 5-325 MG tablet Take 1-2 tablets by mouth every 6 (six) hours as needed for moderate pain. MAXIMUM TOTAL ACETAMINOPHEN DOSE IS 4000 MG PER DAY 08/20/17   Erin Sons, MD    Allergies Patient has no known allergies.  No family history on file.  Social History Social History   Tobacco Use  . Smoking status: Never Smoker  . Smokeless tobacco: Never Used  Substance Use Topics  . Alcohol use: No    Comment: rare - Holidays  . Drug use: No     Review of Systems  Constitutional: No fever/chills Eyes: No visual changes.  Cardiovascular: no chest pain. Respiratory: no cough. No SOB. Gastrointestinal: No abdominal pain.  No nausea, no vomiting.  Musculoskeletal: positive for injury to the fifth digit of the right foot Skin: Negative for rash, abrasions, lacerations, ecchymosis. Neurological: Negative for headaches, focal weakness or numbness. 10-point ROS otherwise negative.  ____________________________________________   PHYSICAL EXAM:  VITAL SIGNS: ED Triage Vitals  Enc Vitals Group     BP 05/23/18 2101 (!) 108/57     Pulse Rate 05/23/18 2059 (!) 57     Resp 05/23/18 2059 20     Temp 05/23/18 2059  98.4 F (36.9 C)     Temp Source 05/23/18 2059 Oral     SpO2 05/23/18 2059 98 %     Weight 05/23/18 2100 142 lb (64.4 kg)     Height 05/23/18 2100 5\' 1"  (1.549 m)     Head Circumference --      Peak Flow --      Pain Score 05/23/18 2100 8     Pain Loc --      Pain Edu? --      Excl. in GC? --      Constitutional: Alert and oriented. Well appearing and in no acute distress. Eyes: Conjunctivae are normal. PERRL. EOMI. Head: Atraumatic. Neck: No stridor.    Cardiovascular: Normal rate, regular rhythm. Normal S1 and S2.  Good peripheral  circulation. Respiratory: Normal respiratory effort without tachypnea or retractions. Lungs CTAB. Good air entry to the bases with no decreased or absent breath sounds. Musculoskeletal: Full range of motion to all extremities. No gross deformities appreciated.  Visualization of the fifth digit right foot reveals mild edema but no erythema, lacerations, abrasions.  No deformity.  Patient able to move the digit appropriately.  Patient is tender palpation over the distal proximal phalanx but no tenderness to palpation over the metatarsal region.  Sensation and capillary refill intact. Neurologic:  Normal speech and language. No gross focal neurologic deficits are appreciated.  Skin:  Skin is warm, dry and intact. No rash noted. Psychiatric: Mood and affect are normal. Speech and behavior are normal. Patient exhibits appropriate insight and judgement.   ____________________________________________   LABS (all labs ordered are listed, but only abnormal results are displayed)  Labs Reviewed - No data to display ____________________________________________  EKG   ____________________________________________  RADIOLOGY I personally viewed and evaluated these images as part of my medical decision making, as well as reviewing the written report by the radiologist.  Concur with radiologist finding of subluxation of the distal phalanx  Dg Foot Complete Right  Result Date: 05/23/2018 CLINICAL DATA:  Small toe injury with pain. Initial encounter. EXAM: RIGHT FOOT COMPLETE - 3+ VIEW COMPARISON:  None. FINDINGS: Single interphalangeal joint of the little digit which is widened and subluxed laterally. No superimposed fracture. First MTP osteoarthritis with bunion and degenerative spurring along the sesamoids. IMPRESSION: 1. Fifth digit interphalangeal widening and subluxation. 2. Negative for fracture. Electronically Signed   By: Marnee Spring M.D.   On: 05/23/2018 21:37     ____________________________________________    PROCEDURES  Procedure(s) performed:    Reduction of dislocation Date/Time: 05/23/2018 10:44 PM Performed by: Racheal Patches, PA-C Authorized by: Racheal Patches, PA-C  Consent: Verbal consent obtained. Risks and benefits: risks, benefits and alternatives were discussed Consent given by: patient Patient understanding: patient states understanding of the procedure being performed Imaging studies: imaging studies available Patient identity confirmed: verbally with patient Time out: Immediately prior to procedure a "time out" was called to verify the correct patient, procedure, equipment, support staff and site/side marked as required. Local anesthesia used: no  Anesthesia: Local anesthesia used: no  Sedation: Patient sedated: no  Patient tolerance: Patient tolerated the procedure well with no immediate complications Comments: Distal traction over the distal phalanx with manipulation results and good palpable reduction of dislocation of DIP joint.       Medications - No data to display   ____________________________________________   INITIAL IMPRESSION / ASSESSMENT AND PLAN / ED COURSE  Pertinent labs & imaging results that were available during my care of  the patient were reviewed by me and considered in my medical decision making (see chart for details).  Review of the [place] CSRS was performed in accordance of the [facility] prior to dispensing any controlled drugs.      Patient's diagnosis is consistent with dislocation of the distal phalanx of the fifth digit right foot.  Patient presents the emergency department with toe injury.  X-ray reveals mild dislocation.  Reduction successful on palpation.  Toes are buddy taped, patient is instructed to use good shoes.. Patient will be discharged home with prescriptions for meloxicam. Patient is to follow up with podiatry as needed or otherwise directed. Patient is given  ED precautions to return to the ED for any worsening or new symptoms.     ____________________________________________  FINAL CLINICAL IMPRESSION(S) / ED DIAGNOSES  Final diagnoses:  Dislocation of phalanx of right foot, initial encounter      NEW MEDICATIONS STARTED DURING THIS VISIT:  ED Discharge Orders         Ordered    meloxicam (MOBIC) 15 MG tablet  Daily     05/23/18 2231              This chart was dictated using voice recognition software/Dragon. Despite best efforts to proofread, errors can occur which can change the meaning. Any change was purely unintentional.    Racheal Patches, PA-C 05/23/18 2246    Phineas Semen, MD 05/23/18 2306

## 2018-05-23 NOTE — ED Notes (Signed)
XR at bedside

## 2018-05-23 NOTE — ED Triage Notes (Signed)
Pt has right 5th toe pain.  States a toy wheel rolled over toe today while playing with a child.   Pt alert

## 2020-01-09 ENCOUNTER — Other Ambulatory Visit: Payer: Self-pay

## 2020-01-09 ENCOUNTER — Ambulatory Visit
Admission: RE | Admit: 2020-01-09 | Discharge: 2020-01-09 | Disposition: A | Discharge status: Home / Self Care | Payer: Self-pay | Source: Ambulatory Visit | Attending: Oncology | Admitting: Oncology

## 2020-01-09 ENCOUNTER — Ambulatory Visit: Payer: Self-pay | Attending: Oncology

## 2020-01-09 VITALS — BP 133/61 | HR 59 | Temp 97.5°F | Ht 61.0 in | Wt 151.9 lb

## 2020-01-09 DIAGNOSIS — Z0000 Encounter for general adult medical examination without abnormal findings: Secondary | ICD-10-CM

## 2020-01-09 NOTE — Progress Notes (Signed)
  Subjective:     Patient ID: Alejandra Navarro, female   DOB: [birth year], 64 y.o.   MRN: [id number]  HPI   Review of Systems     Objective:   Physical Exam Chest:     Breasts:        Right: No swelling, bleeding, inverted nipple, mass, nipple discharge, skin change or tenderness.        Left: No swelling, bleeding, inverted nipple, mass, nipple discharge, skin change or tenderness.  Genitourinary:    Labia:        Right: No rash, tenderness, lesion or injury.        Left: No rash, tenderness, lesion or injury.      Vagina: No signs of injury and foreign body. No vaginal discharge, erythema, tenderness, bleeding, lesions or prolapsed vaginal walls.     Cervix: No cervical motion tenderness, discharge, friability, lesion, erythema, cervical bleeding or eversion.     Uterus: Not deviated, not enlarged, not fixed, not tender and no uterine prolapse.      Adnexa:        Right: No mass, tenderness or fullness.         Left: No mass, tenderness or fullness.          Assessment:    64 year old Hispanic patient presents for [facility] clinic visit. Delos Haring interpreted exam. Patient screened, and meets [facility] eligibility.  Patient does not have insurance, Medicare or Medicaid. Instructed patient on breast self awareness using teach back method.  Clinical breast exam unremarkable. No mass or lump palpated.  Pelvic exam normal.   Risk Assessment    Risk Scores      01/09/2020   Last edited by: Jim Like, RN   5-year risk: 0.9 %   Lifetime risk: 4 %             Plan:     Sent for bilateral screening mammogram. Specimen collected for pap.

## 2020-01-10 NOTE — Progress Notes (Signed)
Letter mailed from [facility] to notify of normal mammogram results.  Patient to return in one year for annual screening.  Pap results negative/negative.  Next pap due in 5 years.  Mailed notification.

## 2020-01-13 LAB — IGP, APTIMA HPV: HPV Aptima: NEGATIVE

## 2020-01-15 NOTE — Progress Notes (Signed)
Letter mailed to patient to notify of normal mammogram, and pap smear results. Next pap due in 5 years.  Copy to [facility].

## 2020-08-18 IMAGING — MG DIGITAL SCREENING BILAT W/ TOMO W/ CAD
8 series · 8 of 24 positions shown · non-contrast
Comparison: Previous exam(s).

CLINICAL DATA: Screening.

EXAM:
DIGITAL SCREENING BILATERAL MAMMOGRAM WITH TOMO AND CAD

[L MLO synth-2D]
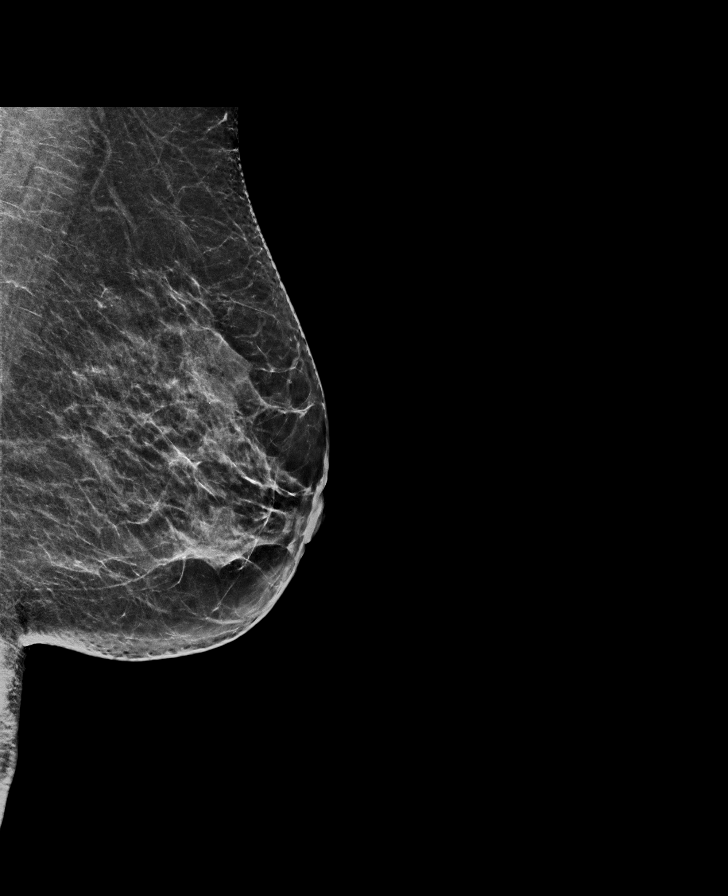

[R MLO synth-2D]
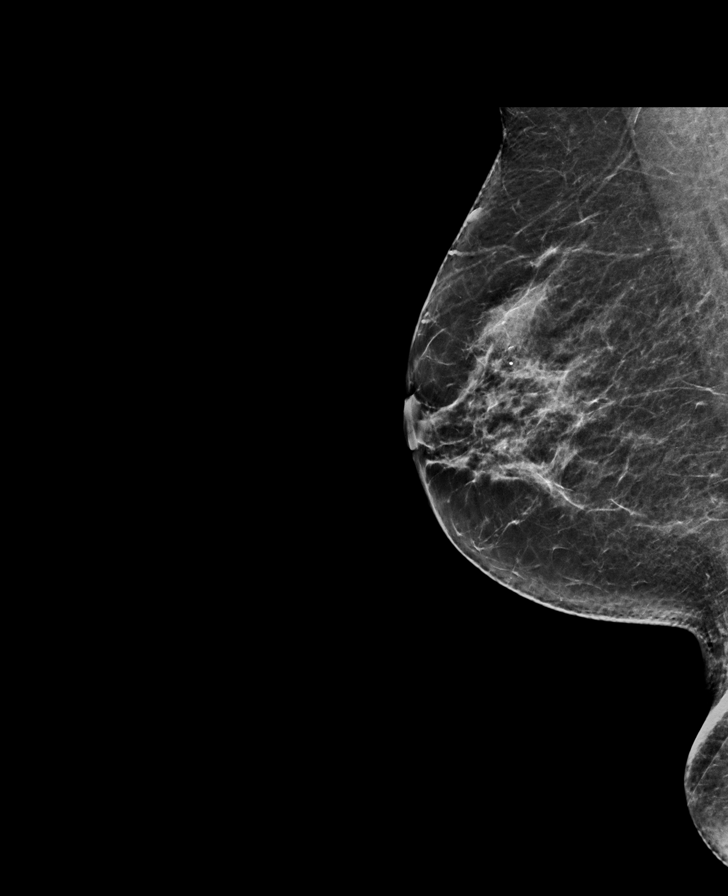

[L CC synth-2D]
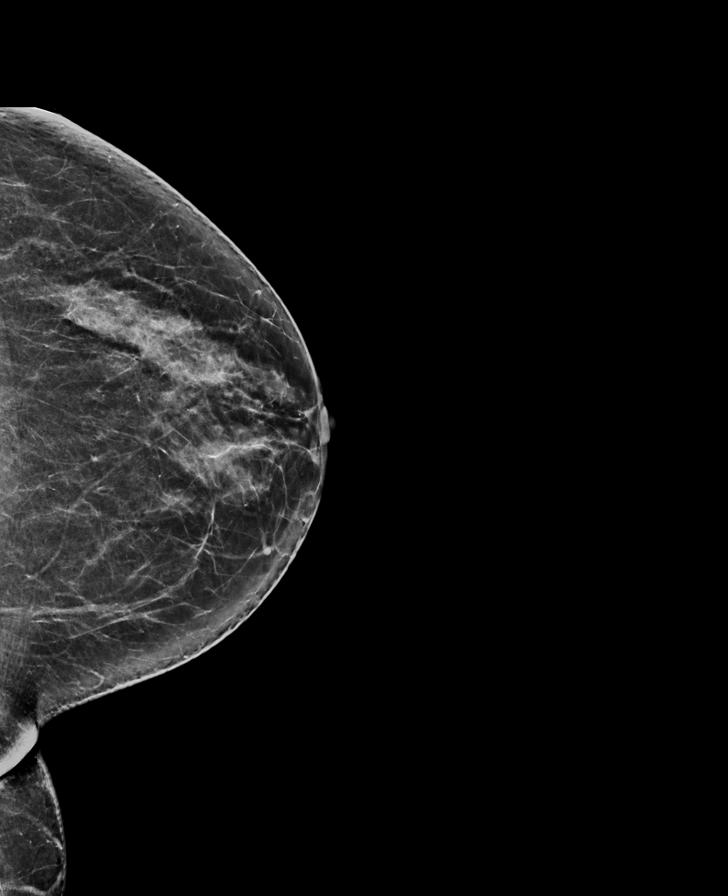

[R CC synth-2D]
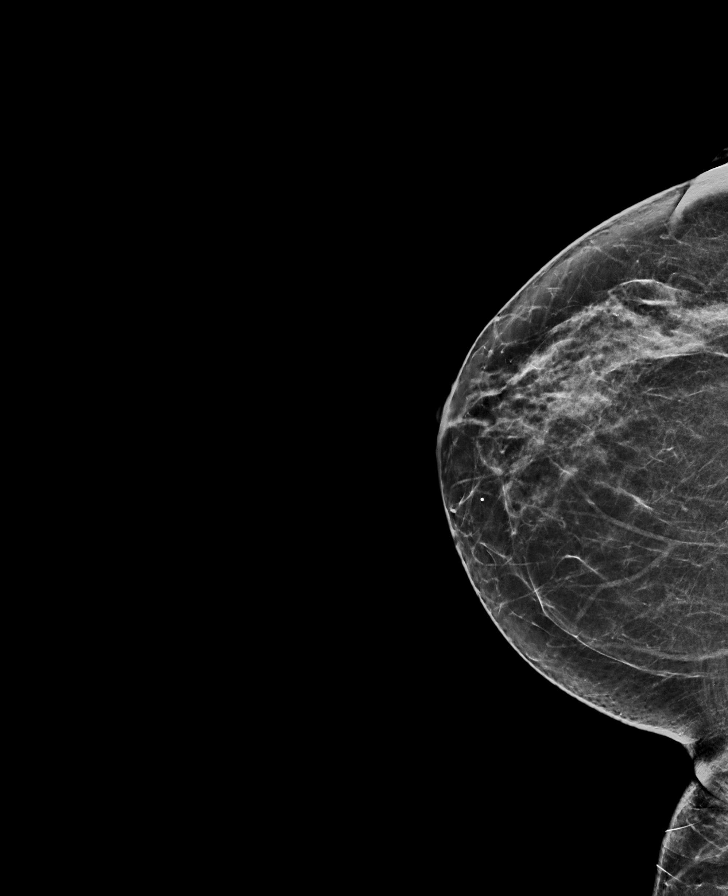

[L CC tomo · tomo slice 34/67.0]
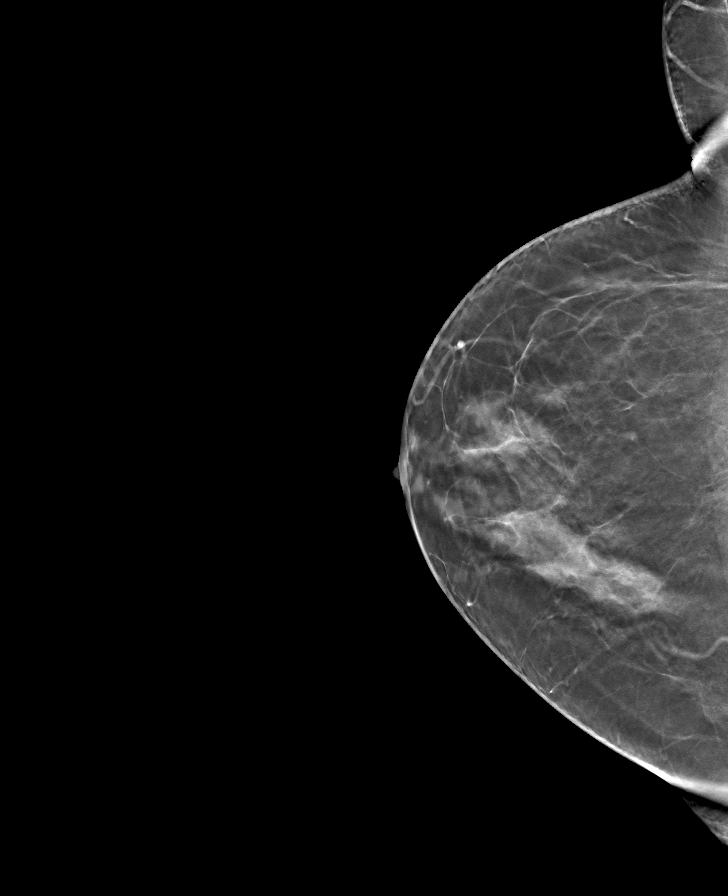

[L MLO tomo · tomo slice 33/65.0]
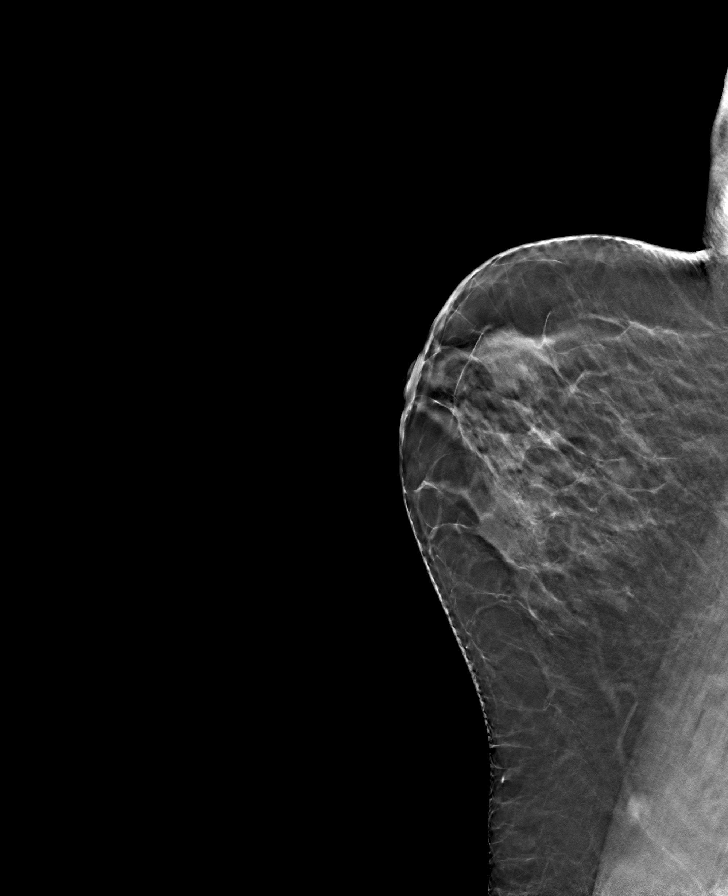

[R CC tomo · tomo slice 33/65.0]
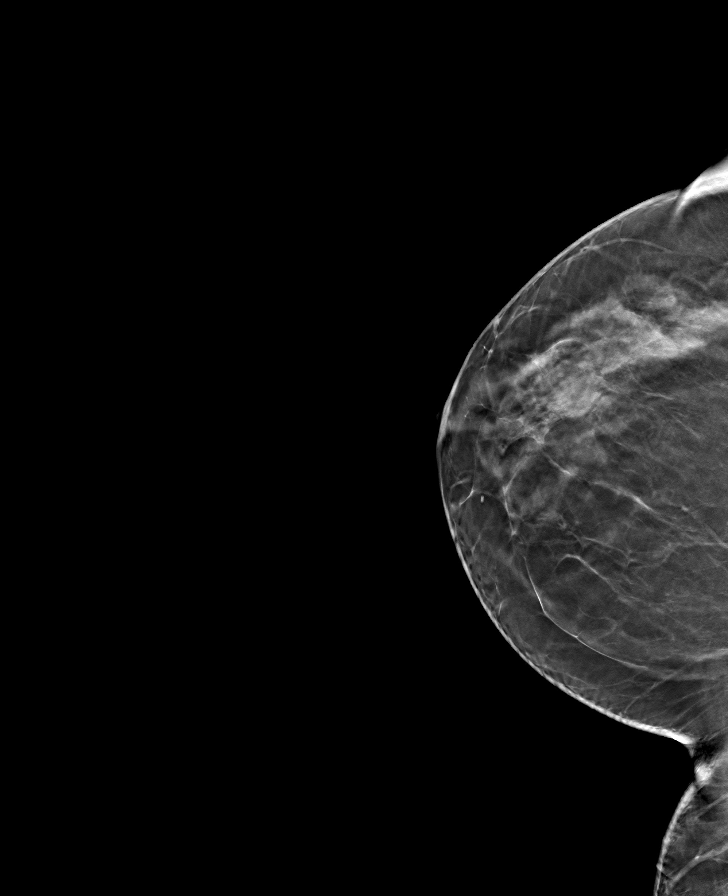

[R MLO tomo · tomo slice 34/67.0]
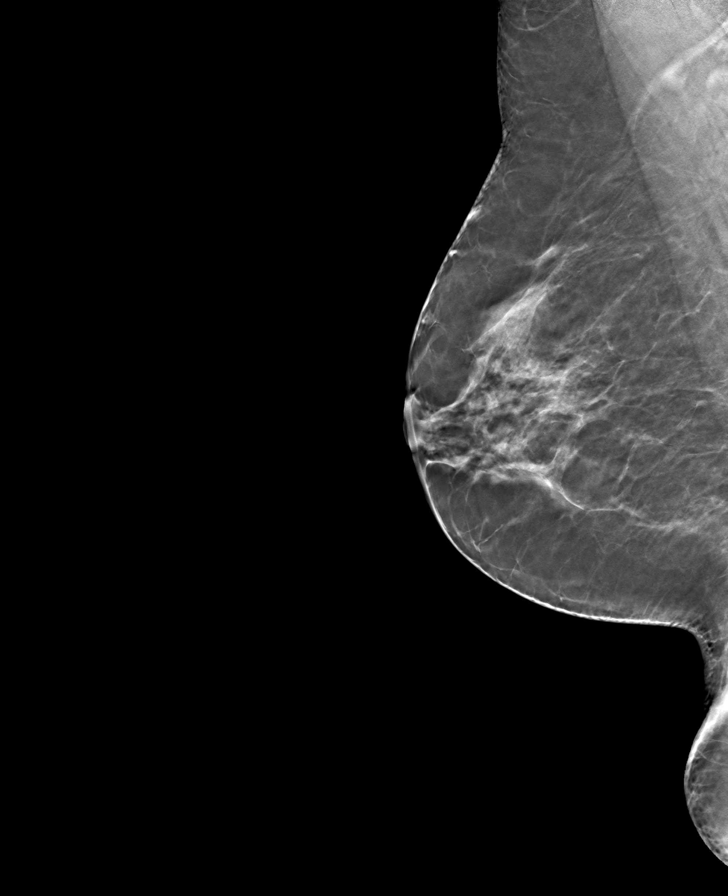

[8 of 24 positions shown; findings below may reference images not displayed]

ACR Breast Density Category c: The breast tissue is heterogeneously
dense, which may obscure small masses.
FINDINGS: There are no findings suspicious for malignancy. Images were
processed with CAD.
IMPRESSION: No mammographic evidence of malignancy. A result letter of this
screening mammogram will be mailed directly to the patient.

RECOMMENDATION:
Screening mammogram in one year. (Code:FT-U-LHB)

BI-RADS CATEGORY  1: Negative.

## 2022-11-10 ENCOUNTER — Other Ambulatory Visit: Payer: Self-pay

## 2022-11-10 DIAGNOSIS — Z1231 Encounter for screening mammogram for malignant neoplasm of breast: Secondary | ICD-10-CM

## 2023-01-11 ENCOUNTER — Ambulatory Visit
Admission: RE | Admit: 2023-01-11 | Discharge: 2023-01-11 | Disposition: A | Discharge status: Home / Self Care | Payer: Medicare Other | Source: Ambulatory Visit | Attending: Family Medicine | Admitting: Family Medicine

## 2023-01-11 DIAGNOSIS — Z1231 Encounter for screening mammogram for malignant neoplasm of breast: Secondary | ICD-10-CM | POA: Insufficient documentation
